# Patient Record
Sex: Male | Born: 1975 | Race: White | Hispanic: No | Marital: Single | Smoking: Current every day smoker
Health system: Southern US, Community
[De-identification: ages and names within clinical notes are randomized; demographics above are authoritative.]

## PROBLEM LIST (undated history)

## (undated) DIAGNOSIS — C801 Malignant (primary) neoplasm, unspecified: Secondary | ICD-10-CM

---

## 2013-12-13 ENCOUNTER — Emergency Department (HOSPITAL_COMMUNITY)
Admission: EM | Admit: 2013-12-13 | Discharge: 2013-12-13 | Disposition: A | Discharge status: Home / Self Care | Payer: BC Managed Care – PPO | Attending: Emergency Medicine | Admitting: Emergency Medicine

## 2013-12-13 ENCOUNTER — Emergency Department (HOSPITAL_COMMUNITY): Payer: BC Managed Care – PPO

## 2013-12-13 ENCOUNTER — Encounter (HOSPITAL_COMMUNITY): Payer: Self-pay | Admitting: Emergency Medicine

## 2013-12-13 DIAGNOSIS — S0083XA Contusion of other part of head, initial encounter: Principal | ICD-10-CM | POA: Insufficient documentation

## 2013-12-13 DIAGNOSIS — S0003XA Contusion of scalp, initial encounter: Secondary | ICD-10-CM | POA: Insufficient documentation

## 2013-12-13 DIAGNOSIS — S0180XA Unspecified open wound of other part of head, initial encounter: Secondary | ICD-10-CM | POA: Insufficient documentation

## 2013-12-13 DIAGNOSIS — R202 Paresthesia of skin: Secondary | ICD-10-CM

## 2013-12-13 DIAGNOSIS — S8990XA Unspecified injury of unspecified lower leg, initial encounter: Secondary | ICD-10-CM | POA: Diagnosis not present

## 2013-12-13 DIAGNOSIS — F17200 Nicotine dependence, unspecified, uncomplicated: Secondary | ICD-10-CM | POA: Insufficient documentation

## 2013-12-13 DIAGNOSIS — S1093XA Contusion of unspecified part of neck, initial encounter: Principal | ICD-10-CM

## 2013-12-13 DIAGNOSIS — Z23 Encounter for immunization: Secondary | ICD-10-CM | POA: Insufficient documentation

## 2013-12-13 DIAGNOSIS — Y92410 Unspecified street and highway as the place of occurrence of the external cause: Secondary | ICD-10-CM | POA: Diagnosis not present

## 2013-12-13 DIAGNOSIS — Y9389 Activity, other specified: Secondary | ICD-10-CM | POA: Diagnosis not present

## 2013-12-13 DIAGNOSIS — S335XXA Sprain of ligaments of lumbar spine, initial encounter: Secondary | ICD-10-CM | POA: Diagnosis not present

## 2013-12-13 DIAGNOSIS — S0181XA Laceration without foreign body of other part of head, initial encounter: Secondary | ICD-10-CM

## 2013-12-13 DIAGNOSIS — S0990XA Unspecified injury of head, initial encounter: Secondary | ICD-10-CM | POA: Diagnosis present

## 2013-12-13 DIAGNOSIS — S3981XA Other specified injuries of abdomen, initial encounter: Secondary | ICD-10-CM | POA: Insufficient documentation

## 2013-12-13 DIAGNOSIS — S0093XA Contusion of unspecified part of head, initial encounter: Secondary | ICD-10-CM

## 2013-12-13 DIAGNOSIS — S99929A Unspecified injury of unspecified foot, initial encounter: Secondary | ICD-10-CM

## 2013-12-13 DIAGNOSIS — S39012A Strain of muscle, fascia and tendon of lower back, initial encounter: Secondary | ICD-10-CM

## 2013-12-13 DIAGNOSIS — S99919A Unspecified injury of unspecified ankle, initial encounter: Secondary | ICD-10-CM | POA: Diagnosis not present

## 2013-12-13 LAB — CBC
HEMATOCRIT: 44.9 % (ref 39.0–52.0)
Hemoglobin: 15.6 g/dL (ref 13.0–17.0)
MCH: 30.8 pg (ref 26.0–34.0)
MCHC: 34.7 g/dL (ref 30.0–36.0)
MCV: 88.7 fL (ref 78.0–100.0)
PLATELETS: 281 10*3/uL (ref 150–400)
RBC: 5.06 MIL/uL (ref 4.22–5.81)
RDW: 13.1 % (ref 11.5–15.5)
WBC: 9.4 10*3/uL (ref 4.0–10.5)

## 2013-12-13 LAB — URINALYSIS, ROUTINE W REFLEX MICROSCOPIC
Bilirubin Urine: NEGATIVE
GLUCOSE, UA: NEGATIVE mg/dL
Hgb urine dipstick: NEGATIVE
Ketones, ur: NEGATIVE mg/dL
LEUKOCYTES UA: NEGATIVE
Nitrite: NEGATIVE
PH: 7 (ref 5.0–8.0)
Protein, ur: NEGATIVE mg/dL
SPECIFIC GRAVITY, URINE: 1.009 (ref 1.005–1.030)
Urobilinogen, UA: 0.2 mg/dL (ref 0.0–1.0)

## 2013-12-13 LAB — PROTIME-INR
INR: 1.04 (ref 0.00–1.49)
Prothrombin Time: 13.4 seconds (ref 11.6–15.2)

## 2013-12-13 LAB — SAMPLE TO BLOOD BANK

## 2013-12-13 LAB — COMPREHENSIVE METABOLIC PANEL
ALBUMIN: 4.3 g/dL (ref 3.5–5.2)
ALK PHOS: 69 U/L (ref 39–117)
ALT: 29 U/L (ref 0–53)
AST: 24 U/L (ref 0–37)
BILIRUBIN TOTAL: 0.3 mg/dL (ref 0.3–1.2)
BUN: 13 mg/dL (ref 6–23)
CHLORIDE: 104 meq/L (ref 96–112)
CO2: 24 mEq/L (ref 19–32)
Calcium: 9.5 mg/dL (ref 8.4–10.5)
Creatinine, Ser: 1.09 mg/dL (ref 0.50–1.35)
GFR calc Af Amer: >90 mL/min (ref >90)
GFR calc non Af Amer: 85 mL/min — ABNORMAL LOW (ref >90)
Glucose, Bld: 114 mg/dL — ABNORMAL HIGH (ref 70–99)
Potassium: 3.8 mEq/L (ref 3.7–5.3)
SODIUM: 144 meq/L (ref 137–147)
TOTAL PROTEIN: 7.5 g/dL (ref 6.0–8.3)

## 2013-12-13 LAB — ETHANOL: Alcohol, Ethyl (B): 201 mg/dL — ABNORMAL HIGH (ref 0–11)

## 2013-12-13 LAB — I-STAT CG4 LACTIC ACID, ED: LACTIC ACID, VENOUS: 2.34 mmol/L — AB (ref 0.5–2.2)

## 2013-12-13 LAB — I-STAT CHEM 8, ED
BUN: 12 mg/dL (ref 6–23)
CALCIUM ION: 1.1 mmol/L — AB (ref 1.12–1.23)
CHLORIDE: 106 meq/L (ref 96–112)
Creatinine, Ser: 1.4 mg/dL — ABNORMAL HIGH (ref 0.50–1.35)
Glucose, Bld: 111 mg/dL — ABNORMAL HIGH (ref 70–99)
HCT: 47 % (ref 39.0–52.0)
Hemoglobin: 16 g/dL (ref 13.0–17.0)
POTASSIUM: 3.5 meq/L — AB (ref 3.7–5.3)
Sodium: 145 mEq/L (ref 137–147)
TCO2: 25 mmol/L (ref 0–100)

## 2013-12-13 MED ORDER — IOHEXOL 300 MG/ML  SOLN: 100.0000 mL | Freq: Once | INTRAMUSCULAR | Status: DC | PRN

## 2013-12-13 MED ORDER — TETANUS-DIPHTHERIA TOXOIDS TD 5-2 LFU IM INJ
0.5000 mL | INJECTION | Freq: Once | INTRAMUSCULAR | Status: DC
  Filled 2013-12-13: qty 0.5

## 2013-12-13 MED ORDER — MORPHINE SULFATE 4 MG/ML IJ SOLN
4.0000 mg | Freq: Once | INTRAMUSCULAR | Status: AC
  Administered 2013-12-13: 4 mg via INTRAVENOUS
  Filled 2013-12-13: qty 1

## 2013-12-13 MED ORDER — OXYCODONE-ACETAMINOPHEN 5-325 MG PO TABS: 2.0000 | ORAL_TABLET | ORAL | Status: DC | PRN

## 2013-12-13 MED ORDER — NAPROXEN 500 MG PO TABS: 500.0000 mg | ORAL_TABLET | Freq: Two times a day (BID) | ORAL | Status: DC

## 2013-12-13 MED ORDER — SODIUM CHLORIDE 0.9 % IV SOLN
INTRAVENOUS | Status: AC | PRN
  Administered 2013-12-13: 10 mL/h via INTRAVENOUS

## 2013-12-13 MED ORDER — METHOCARBAMOL 750 MG PO TABS: 750.0000 mg | ORAL_TABLET | Freq: Four times a day (QID) | ORAL | Status: DC

## 2013-12-13 MED ORDER — METHOCARBAMOL 500 MG PO TABS
1000.0000 mg | ORAL_TABLET | Freq: Once | ORAL | Status: AC
  Administered 2013-12-13: 1000 mg via ORAL
  Filled 2013-12-13: qty 2

## 2013-12-13 MED ORDER — TETANUS-DIPHTH-ACELL PERTUSSIS 5-2.5-18.5 LF-MCG/0.5 IM SUSP
0.5000 mL | Freq: Once | INTRAMUSCULAR | Status: AC
  Administered 2013-12-13: 0.5 mL via INTRAMUSCULAR

## 2013-12-13 NOTE — ED Notes (Signed)
Dr at bedside for suturing.

## 2013-12-13 NOTE — ED Notes (Signed)
Patient transported to CT 

## 2013-12-13 NOTE — ED Notes (Signed)
Pt able to ambulate down hall without assistance
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
## 2013-12-13 NOTE — ED Notes (Signed)
Returned from ct 

## 2013-12-13 NOTE — ED Notes (Signed)
See trauma narrator 
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
## 2014-07-20 ENCOUNTER — Emergency Department (HOSPITAL_COMMUNITY)
Admission: EM | Admit: 2014-07-20 | Discharge: 2014-07-20 | Disposition: A | Discharge status: Home / Self Care | Payer: Self-pay | Attending: Emergency Medicine | Admitting: Emergency Medicine

## 2014-07-20 ENCOUNTER — Emergency Department (HOSPITAL_COMMUNITY)
Admission: EM | Admit: 2014-07-20 | Discharge: 2014-07-20 | Disposition: A | Discharge status: Home / Self Care | Payer: BC Managed Care – PPO | Attending: Emergency Medicine | Admitting: Emergency Medicine

## 2014-07-20 ENCOUNTER — Encounter (HOSPITAL_COMMUNITY): Payer: Self-pay | Admitting: *Deleted

## 2014-07-20 DIAGNOSIS — Z79899 Other long term (current) drug therapy: Secondary | ICD-10-CM | POA: Insufficient documentation

## 2014-07-20 DIAGNOSIS — Z791 Long term (current) use of non-steroidal anti-inflammatories (NSAID): Secondary | ICD-10-CM | POA: Insufficient documentation

## 2014-07-20 DIAGNOSIS — T542X1A Toxic effect of corrosive acids and acid-like substances, accidental (unintentional), initial encounter: Secondary | ICD-10-CM | POA: Diagnosis not present

## 2014-07-20 DIAGNOSIS — H578 Other specified disorders of eye and adnexa: Secondary | ICD-10-CM | POA: Insufficient documentation

## 2014-07-20 DIAGNOSIS — Y9389 Activity, other specified: Secondary | ICD-10-CM | POA: Insufficient documentation

## 2014-07-20 DIAGNOSIS — T2662XA Corrosion of cornea and conjunctival sac, left eye, initial encounter: Secondary | ICD-10-CM | POA: Diagnosis not present

## 2014-07-20 DIAGNOSIS — Y9289 Other specified places as the place of occurrence of the external cause: Secondary | ICD-10-CM | POA: Insufficient documentation

## 2014-07-20 DIAGNOSIS — Z720 Tobacco use: Secondary | ICD-10-CM | POA: Diagnosis not present

## 2014-07-20 DIAGNOSIS — X58XXXA Exposure to other specified factors, initial encounter: Secondary | ICD-10-CM | POA: Diagnosis not present

## 2014-07-20 DIAGNOSIS — T2661XA Corrosion of cornea and conjunctival sac, right eye, initial encounter: Secondary | ICD-10-CM | POA: Diagnosis not present

## 2014-07-20 DIAGNOSIS — Y998 Other external cause status: Secondary | ICD-10-CM | POA: Diagnosis not present

## 2014-07-20 MED ORDER — ERYTHROMYCIN 5 MG/GM OP OINT: TOPICAL_OINTMENT | Freq: Once | OPHTHALMIC | Status: DC

## 2014-07-20 MED ORDER — TETRACAINE HCL 0.5 % OP SOLN
2.0000 [drp] | Freq: Once | OPHTHALMIC | Status: AC
  Administered 2014-07-20: 2 [drp] via OPHTHALMIC
  Filled 2014-07-20: qty 2

## 2014-07-20 MED ORDER — ARTIFICIAL TEARS OP OINT
TOPICAL_OINTMENT | Freq: Once | OPHTHALMIC | Status: AC
  Administered 2014-07-20: 07:00:00 via OPHTHALMIC
  Filled 2014-07-20: qty 3.5

## 2014-07-20 NOTE — ED Notes (Signed)
Bed: BW38 Expected date:  Expected time:  Means of arrival:  Comments: EMS--Red eyes

## 2014-07-20 NOTE — Discharge Instructions (Signed)
Please use the antibiotic ointment and the artificial tears. See the Eye doctor in 2-3 days.  Return to the ER if there is any visual complains, severe pain, or pus drainage.   Chemical Conjunctivitis Chemical conjunctivitis is an irritation of the underside of the eyelid and the white part of the eye. Conjunctivitis can be caused by infection, allergy or chemical irritation. In your case it has been caused by a chemical irritation of the eye. Symptoms almost always include: tearing, light sensitivity, gritty feeling (sensation) in the eyes, swelling of your eyelids, and often severe pain. In spite of the severe pain, this irritation will run its course and will improve within 24 hours.  HOME CARE INSTRUCTIONS   To ease discomfort apply a cool, clean wash cloth to your eye for 10 to 20 minutes, 3 to 4 times per day.  Do not rub your eyes.  Gently wipe away any discharge from the eyes with moistened tissues.  Wash your hands often with soap and use paper towels to dry.  Sunglasses may be helpful if light bothers your eyes.  Do not use eye make-up.  Do not use contact lenses until the irritation is gone.  Do not operate machinery or drive if your vision is blurred.  Take medications as directed by your caregiver. Artificial tears may ease discomfort.  Avoid the chemical or surroundings which caused the problem. Always use eye protection as necessary. SEEK MEDICAL CARE IF:   The eye is still pink (inflamed) 3 days after beginning treatment.  Pain in the eye increases.  You have discharge coming from either eye.  Your eyelids are stuck together in the morning.  You have an increased sensitivity to light.  An oral temperature above 102 F (38.9 C) develops.  You develop facial pain.  You have any problems that may be related to the medicine you are taking. SEEK IMMEDIATE MEDICAL CARE IF:   Your vision is getting worse.  You develop severe eye pain. MAKE SURE YOU:    Understand these instructions.  Will watch your condition.  Will get help right away if you are not doing well or get worse. Document Released: 04/27/2005 Document Revised: 10/10/2011 Document Reviewed: 03/05/2008 [facility] Patient Information 2015 [place], [place]. This information is not intended to replace advice given to you by your health care provider. Make sure you discuss any questions you have with your health care provider.
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
## 2014-07-20 NOTE — ED Notes (Signed)
MD at bedside. 

## 2014-07-20 NOTE — ED Notes (Signed)
Bilateral Morgen's lens placed by EDP  Irrigation started
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
## 2014-07-20 NOTE — ED Notes (Signed)
Patient used wrong contact lens solution--used cleaning solution that has hydrogen peroxide, instead of saline rinsing solution Patient washed eyes out with copious amounts of water before calling EMS and when they arrived on scene Patient wanted to come to ED because his eye are still red

## 2015-06-25 IMAGING — CT CT HEAD W/O CM
3 of 6 series · 12 of 47 positions shown, 14 images · non-contrast
Comparison: None.

CLINICAL DATA: Trauma.

EXAM:
CT HEAD WITHOUT CONTRAST
CT CERVICAL SPINE WITHOUT CONTRAST
TECHNIQUE: Multidetector CT imaging of the head and cervical spine was
performed following the standard protocol without intravenous
contrast. Multiplanar CT image reconstructions of the cervical spine
were also generated.

[Series 3: head 2.0 h70h · axial · 0.49mm/px · 1 of 90 slices shown]
[im 13/90  brain]
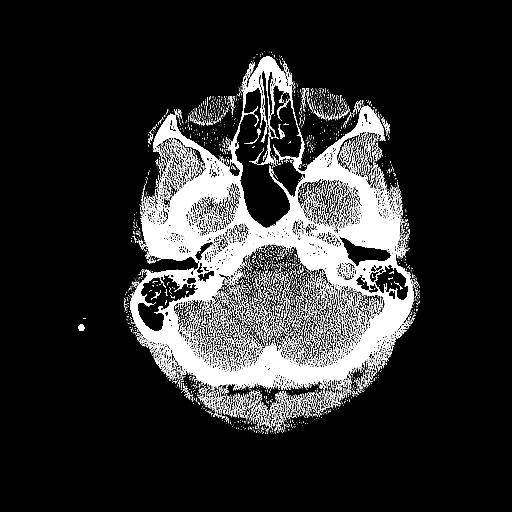

[Series 7: coronals · coronal · 0.26mm/px · 3 of 57 slices shown]
[im 19/57  brain]
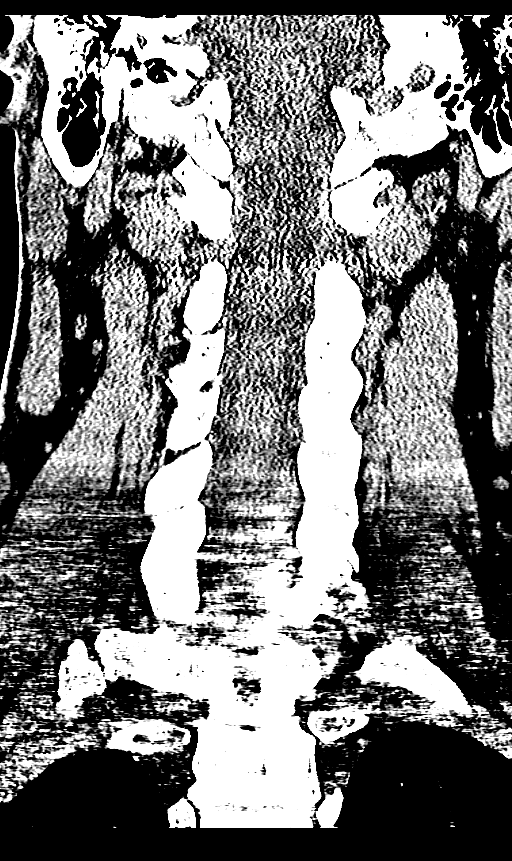
[im 25/57  brain]
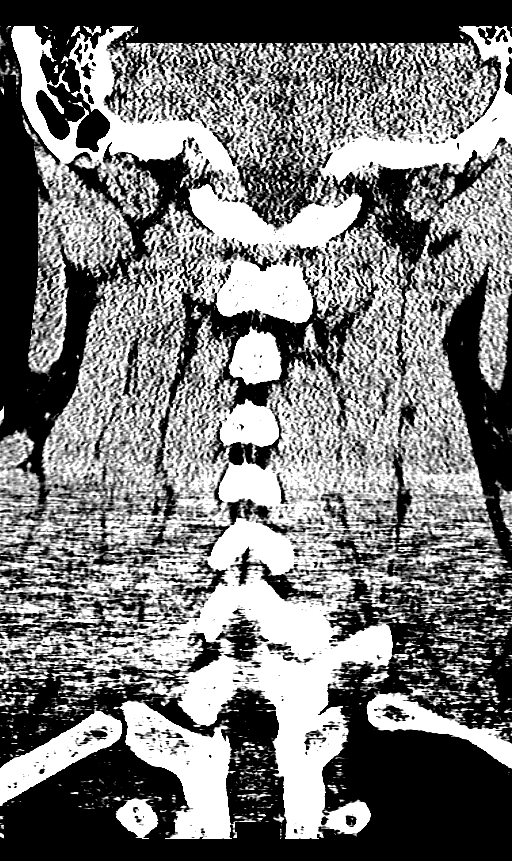
[im 32/57  brain]
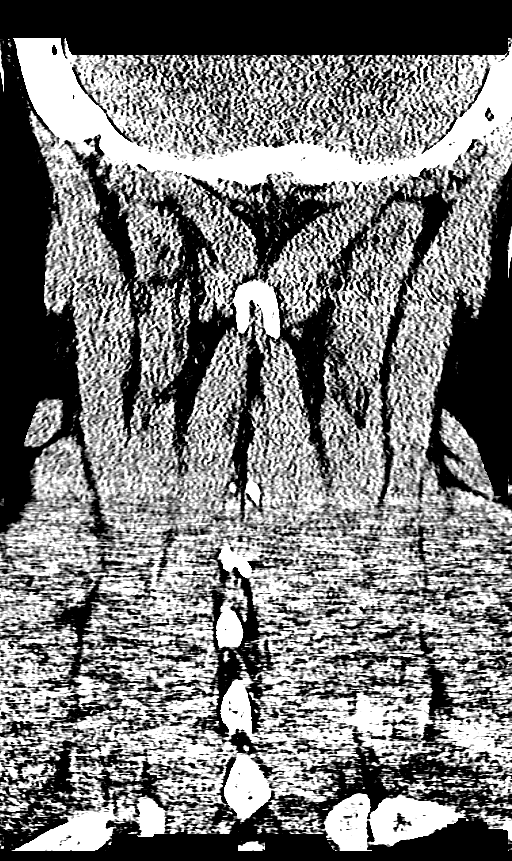

[Series 9: orthogonals · axial · 0.25mm/px · z∈[+1508,+1670]mm · 8 of 111 slices shown, 10 images]
[im 13/111  brain]
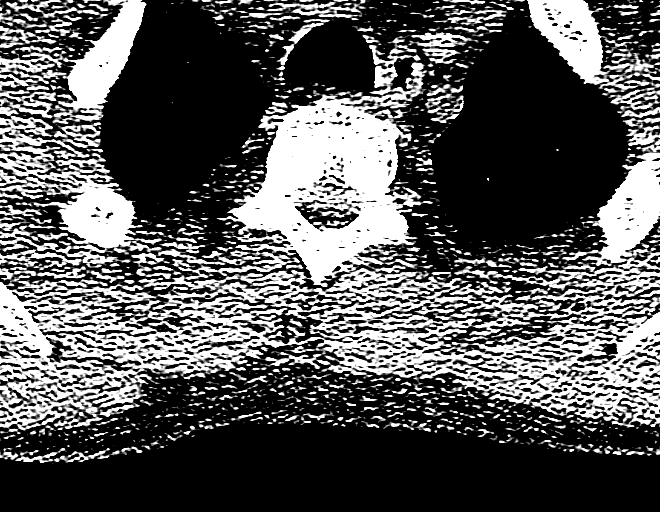
[im 13/111  bone]
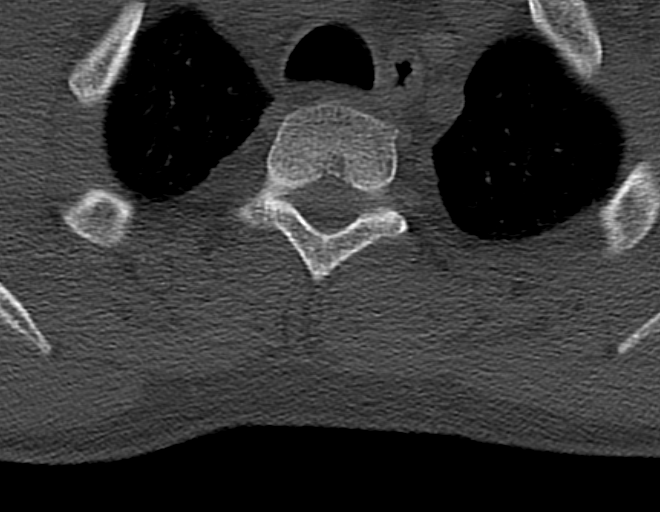
[im 25/111  brain]
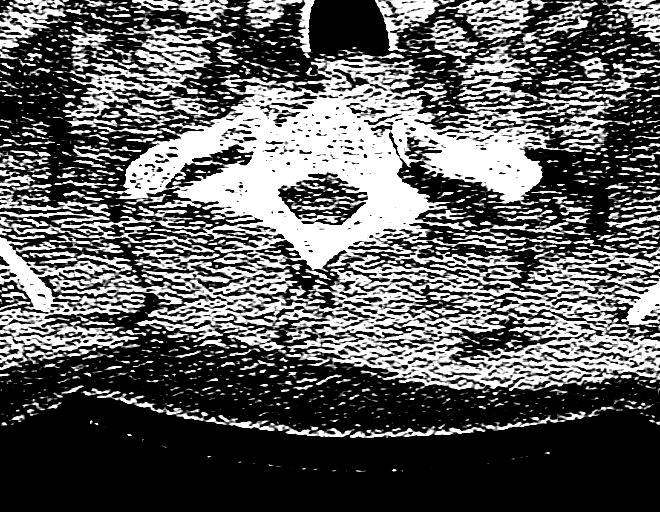
[im 37/111  brain]
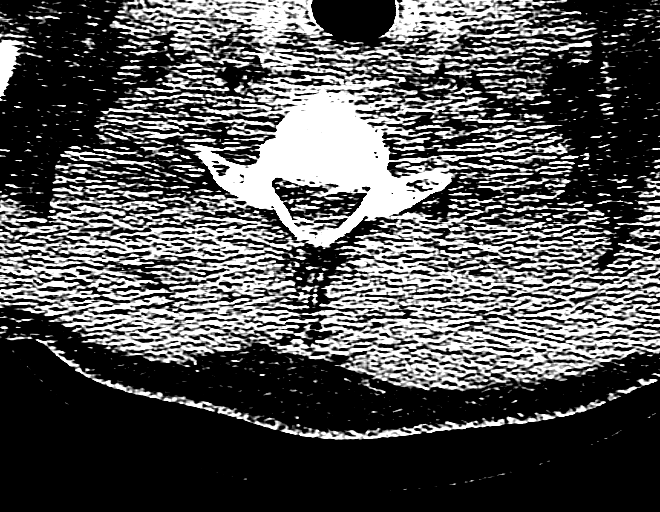
[im 49/111  brain]
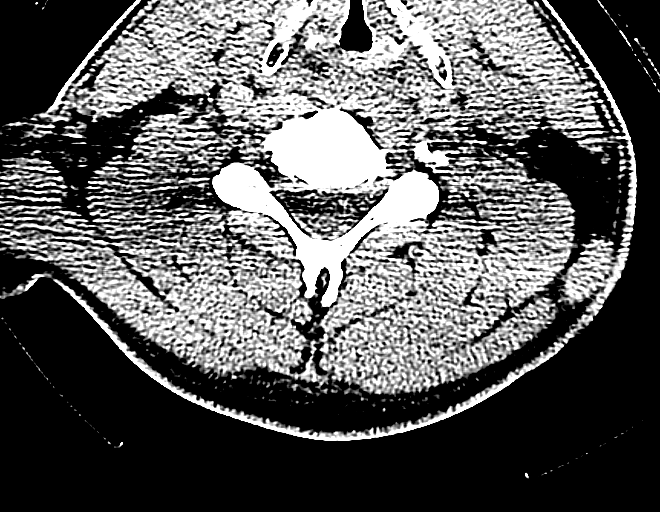
[im 62/111  brain]
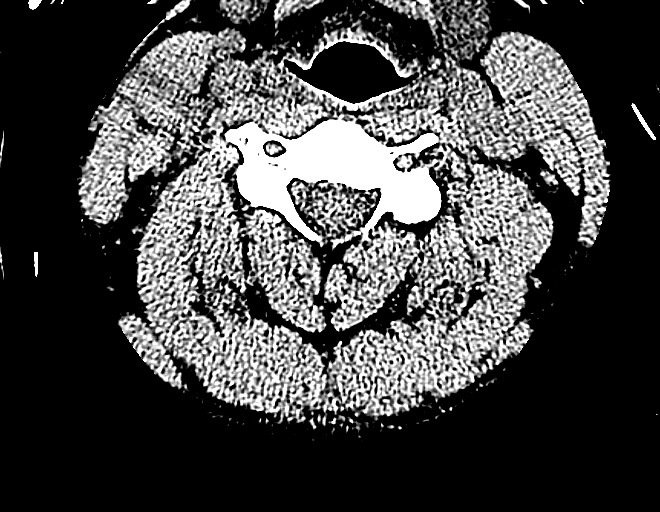
[im 62/111  bone]
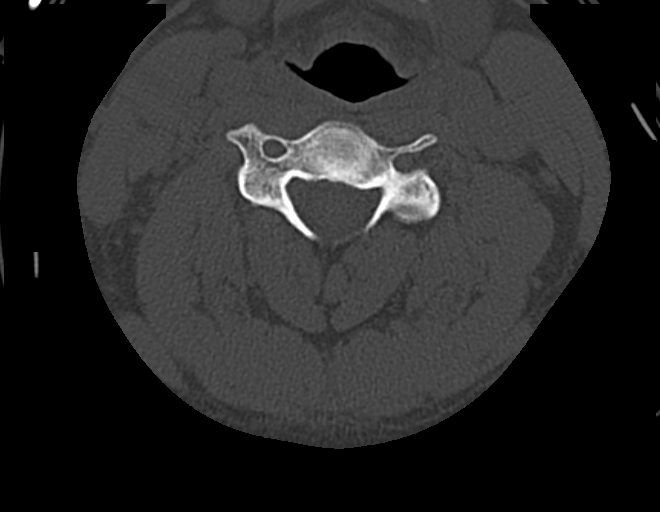
[im 74/111  brain]
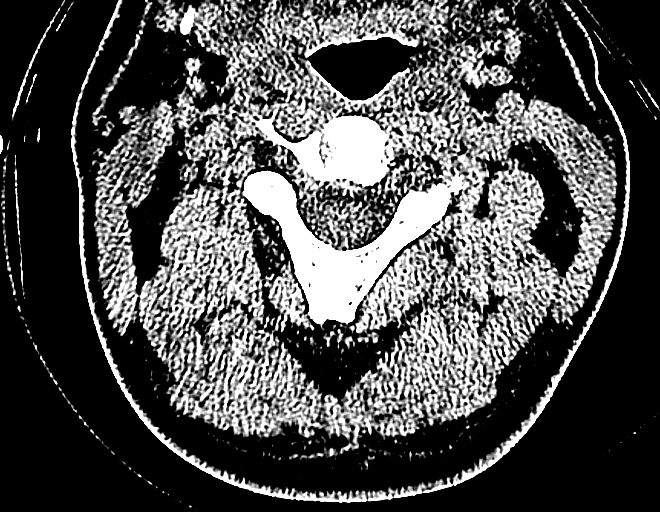
[im 86/111  brain]
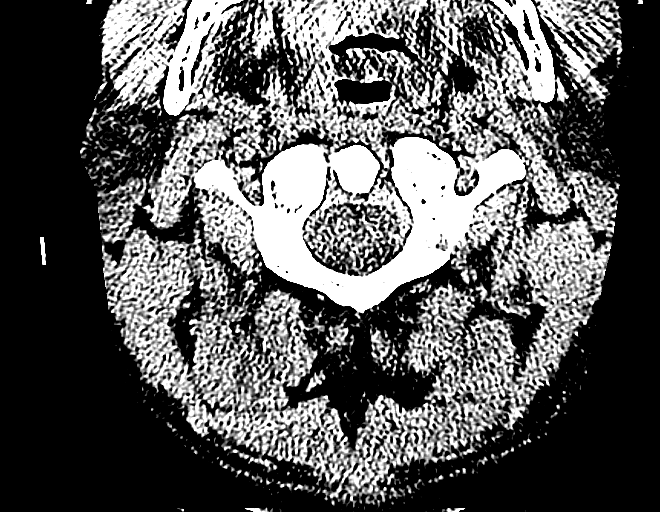
[im 98/111  brain]
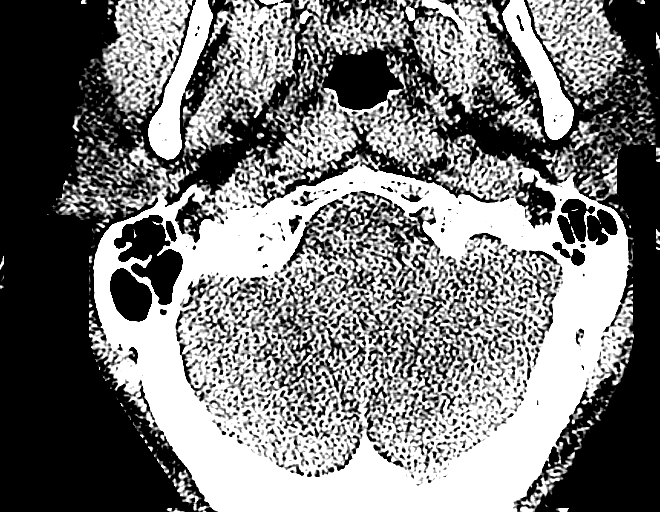

[12 of 47 positions shown; findings below may reference images not displayed]

FINDINGS: CT HEAD FINDINGS

Ventricles and sulci appear symmetrical. Small subcutaneous scalp
hematoma and laceration over the right supraorbital region. No mass
effect or midline shift. No abnormal extra-axial fluid collections.
Gray-white matter junctions are distinct. Basal cisterns are not
effaced. No evidence of acute intracranial hemorrhage. No depressed
skull fractures. Minimal mucosal thickening in the maxillary antra
and ethmoid air cells.

CT CERVICAL SPINE FINDINGS

Straightening of the usual cervical lordosis which may be due to
patient positioning but ligamentous injury or muscle spasm could
also have this appearance and are not excluded. Degenerative changes
with disc space narrowing and endplate hypertrophic changes at C5-6
and C6-7 levels. Disc space heights are otherwise preserved. No
vertebral compression deformities. Normal alignment of the facet
joints. C1-2 articulation appears intact. No prevertebral soft
tissue swelling. No focal bone lesion or bone destruction. Bone
cortex and trabecular architecture appear intact.
IMPRESSION: No acute intracranial abnormalities.

Nonspecific straightening of the usual cervical lordosis. No
displaced fractures are appreciated.
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
## 2016-01-13 ENCOUNTER — Ambulatory Visit (INDEPENDENT_AMBULATORY_CARE_PROVIDER_SITE_OTHER): Payer: BLUE CROSS/BLUE SHIELD | Admitting: Family Medicine

## 2016-01-13 VITALS — BP 124/87 | HR 93 | Temp 98.0°F | Resp 16 | Ht 71.0 in | Wt 198.0 lb

## 2016-01-13 DIAGNOSIS — L989 Disorder of the skin and subcutaneous tissue, unspecified: Secondary | ICD-10-CM

## 2016-01-13 DIAGNOSIS — M722 Plantar fascial fibromatosis: Secondary | ICD-10-CM

## 2016-01-13 DIAGNOSIS — D2339 Other benign neoplasm of skin of other parts of face: Secondary | ICD-10-CM
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
## 2016-01-13 NOTE — Patient Instructions (Addendum)
WOUND CARE Please return in 7 days to have your stitches/staples removed or sooner if you have concerns. Marland Kitchen Keep area clean and dry for 24 hours. Do not remove bandage, if applied. . After 24 hours, remove bandage and wash wound gently with mild soap and warm water. Reapply a new bandage after cleaning wound, if directed. . Continue daily cleansing with soap and water until stitches/staples are removed. . Do not apply any ointments or creams to the wound while stitches/staples are in place, as this may cause delayed healing. . Notify the office if you experience any of the following signs of infection: Swelling, redness, pus drainage, streaking, fever >101.0 F . Notify the office if you experience excessive bleeding that does not stop after 15-20 minutes of constant, firm pressure.      IF you received an x-ray today, you will receive an invoice from [facility]. Please contact [facility] at [phone] with questions or concerns regarding your invoice.   IF you received labwork today, you will receive an invoice from [employer]. Please contact [facility] at [phone] with questions or concerns regarding your invoice.   Our billing staff will not be able to assist you with questions regarding bills from these companies.  You will be contacted with the lab results as soon as they are available. The fastest way to get your results is to activate your My Chart account. Instructions are located on the last page of this paperwork. If you have not heard from [place] regarding the results in 2 weeks, please contact this office.

## 2016-01-13 NOTE — Progress Notes (Signed)
Patient ID: Bruce Rhodes, male   DOB: 1976/02/02, 40 y.o.   MRN: [id number]  Procedure:  Anesthetic allergies reviewed.  Patient anesthetized with 2% Lidocaine with epi x 3.  The first lesion was excised via 3 mm punch and repaired with one HM matress suture with 6-0 vicryl.  The second lesion was cut away in an elliptical fashion and repaired with one HM suture and one simple.  The lesion of his back was cut away in an elliptical fashion and repaired with 6-0 simple running suture.  The patient tolerated the procedure without complaint. Philis Fendt, MS, PA-C 2:23 PM, 01/13/2016

## 2020-11-14 ENCOUNTER — Other Ambulatory Visit: Payer: Self-pay

## 2020-11-14 ENCOUNTER — Emergency Department (HOSPITAL_COMMUNITY)
Admission: EM | Admit: 2020-11-14 | Discharge: 2020-11-14 | Disposition: A | Discharge status: Home / Self Care | Payer: 59 | Attending: Emergency Medicine | Admitting: Emergency Medicine

## 2020-11-14 ENCOUNTER — Encounter (HOSPITAL_COMMUNITY): Payer: Self-pay

## 2020-11-14 DIAGNOSIS — R630 Anorexia: Secondary | ICD-10-CM | POA: Insufficient documentation

## 2020-11-14 DIAGNOSIS — Z8547 Personal history of malignant neoplasm of testis: Secondary | ICD-10-CM | POA: Insufficient documentation

## 2020-11-14 DIAGNOSIS — R5381 Other malaise: Secondary | ICD-10-CM | POA: Diagnosis not present

## 2020-11-14 DIAGNOSIS — R5383 Other fatigue: Secondary | ICD-10-CM | POA: Diagnosis not present

## 2020-11-14 DIAGNOSIS — F17200 Nicotine dependence, unspecified, uncomplicated: Secondary | ICD-10-CM | POA: Insufficient documentation

## 2020-11-14 DIAGNOSIS — R55 Syncope and collapse: Secondary | ICD-10-CM | POA: Insufficient documentation

## 2020-11-14 DIAGNOSIS — R110 Nausea: Secondary | ICD-10-CM | POA: Diagnosis not present

## 2020-11-14 DIAGNOSIS — R509 Fever, unspecified: Secondary | ICD-10-CM | POA: Insufficient documentation

## 2020-11-14 HISTORY — DX: Malignant (primary) neoplasm, unspecified: C80.1

## 2020-11-14 LAB — COMPREHENSIVE METABOLIC PANEL
ALT: 24 U/L (ref 0–44)
AST: 17 U/L (ref 15–41)
Albumin: 3.6 g/dL (ref 3.5–5.0)
Alkaline Phosphatase: 59 U/L (ref 38–126)
Anion gap: 9 (ref 5–15)
BUN: 27 mg/dL — ABNORMAL HIGH (ref 6–20)
CO2: 26 mmol/L (ref 22–32)
Calcium: 8.6 mg/dL — ABNORMAL LOW (ref 8.9–10.3)
Chloride: 99 mmol/L (ref 98–111)
Creatinine, Ser: 1.15 mg/dL (ref 0.61–1.24)
GFR, Estimated: >60 mL/min (ref >60)
Glucose, Bld: 102 mg/dL — ABNORMAL HIGH (ref 70–99)
Potassium: 3.5 mmol/L (ref 3.5–5.1)
Sodium: 134 mmol/L — ABNORMAL LOW (ref 135–145)
Total Bilirubin: 1 mg/dL (ref 0.3–1.2)
Total Protein: 6.7 g/dL (ref 6.5–8.1)

## 2020-11-14 LAB — CBC WITH DIFFERENTIAL/PLATELET
Abs Immature Granulocytes: 0.1 10*3/uL — ABNORMAL HIGH (ref 0.00–0.07)
Basophils Absolute: 0 10*3/uL (ref 0.0–0.1)
Basophils Relative: 0 %
Eosinophils Absolute: 0 10*3/uL (ref 0.0–0.5)
Eosinophils Relative: 0 %
HCT: 38.4 % — ABNORMAL LOW (ref 39.0–52.0)
Hemoglobin: 13.6 g/dL (ref 13.0–17.0)
Immature Granulocytes: 1 %
Lymphocytes Relative: 9 %
Lymphs Abs: 1.3 10*3/uL (ref 0.7–4.0)
MCH: 30 pg (ref 26.0–34.0)
MCHC: 35.4 g/dL (ref 30.0–36.0)
MCV: 84.6 fL (ref 80.0–100.0)
Monocytes Absolute: 0.3 10*3/uL (ref 0.1–1.0)
Monocytes Relative: 2 %
Neutro Abs: 12.4 10*3/uL — ABNORMAL HIGH (ref 1.7–7.7)
Neutrophils Relative %: 88 %
Platelets: 288 10*3/uL (ref 150–400)
RBC: 4.54 MIL/uL (ref 4.22–5.81)
RDW: 13.1 % (ref 11.5–15.5)
WBC: 14 10*3/uL — ABNORMAL HIGH (ref 4.0–10.5)
nRBC: 0 % (ref 0.0–0.2)

## 2020-11-14 MED ORDER — SODIUM CHLORIDE 0.9 % IV BOLUS
1000.0000 mL | Freq: Once | INTRAVENOUS | Status: AC
Start: 2020-11-14 — End: 2020-11-14
  Administered 2020-11-14: 1000 mL via INTRAVENOUS

## 2020-11-14 MED ORDER — SODIUM CHLORIDE 0.9 % IV BOLUS
1000.0000 mL | Freq: Once | INTRAVENOUS | Status: AC
  Administered 2020-11-14: 1000 mL via INTRAVENOUS

## 2020-11-14 NOTE — ED Triage Notes (Signed)
Patient coming from [facility] with c/o syncope episode. Loss of LOC. Patient is currently being treated for cancer. Last chemo yesterday. Given NS 900 cc per ems. BP was 70 systolic at seen.

## 2020-11-14 NOTE — ED Provider Notes (Signed)
[facility] DEPT Provider Note   CSN: [id number] Arrival date & time: 11/14/20  1043     History No chief complaint on file.   Bruce Rhodes is a 45 y.o. male.  He was just admitted to an outside hospital for neutropenia and fever.  Cultures were within normal limits.  He has been struggling with some mouth ulcers, and he has not had much of an appetite.  When he was at [facility], he started to feel very dizzy, sat down, and had a presyncopal episode.  About 5 minutes later, he had another very brief episode.  He was attended by family and bystanders immediately.  EMS gave him IV fluids for a systolic blood pressure of 70.  The history is provided by the patient.  Loss of Consciousness Episode history:  Multiple Most recent episode:  Today Timing:  Constant Progression:  Resolved Chronicity:  New Context comment:  On chemotherapy for left testicular cancer and has not been eating and drinking well secondary to medication side effects. Witnessed: yes   Relieved by:  Lying down Worsened by:  Nothing Ineffective treatments:  None tried Associated symptoms: malaise/fatigue   Associated symptoms: no chest pain, no confusion, no diaphoresis, no difficulty breathing, no fever, no focal weakness, no palpitations, no recent fall, no recent injury, no seizures, no shortness of breath, no visual change and no vomiting        Past Medical History:  Diagnosis Date  . Cancer [facility])    testicular CA    There are no problems to display for this patient.   No past surgical history on file.     No family history on file.  Social History   Tobacco Use  . Smoking status: Current Every Day Smoker  . Smokeless tobacco: Never Used  Substance Use Topics  . Alcohol use: Not Currently  . Drug use: No    Home Medications Prior to Admission medications   Medication Sig Start Date End Date Taking? Authorizing Provider  hydroxypropyl methylcellulose /  hypromellose (ISOPTO TEARS / GONIOVISC) 2.5 % ophthalmic solution Place 1 drop into both eyes 3 (three) times daily as needed for dry eyes. Reported on 01/13/2016    [provider]  hydroxypropyl methylcellulose / hypromellose (ISOPTO TEARS / GONIOVISC) 2.5 % ophthalmic solution Place 1 drop into both eyes 3 (three) times daily as needed for dry eyes. Reported on 01/13/2016    [provider]  ibuprofen (ADVIL,MOTRIN) 200 MG tablet Take 400 mg by mouth every 6 (six) hours as needed for moderate pain.    [provider]  methocarbamol (ROBAXIN-750) 750 MG tablet Take 1 tablet (750 mg total) by mouth 4 (four) times daily. Patient not taking: Reported on 07/20/2014 12/13/13   Linton Flemings, MD  naproxen (NAPROSYN) 500 MG tablet Take 1 tablet (500 mg total) by mouth 2 (two) times daily. Patient not taking: Reported on 07/20/2014 12/13/13   Linton Flemings, MD  oxyCODONE-acetaminophen (PERCOCET/ROXICET) 5-325 MG per tablet Take 2 tablets by mouth every 4 (four) hours as needed for severe pain. Patient not taking: Reported on 07/20/2014 12/13/13   Linton Flemings, MD  Polyethyl Glycol-Propyl Glycol (SYSTANE ULTRA) 0.4-0.3 % SOLN Apply 1 drop to eye 2 (two) times daily as needed (dry eyes). Reported on 01/13/2016    [provider]  Polyethyl Glycol-Propyl Glycol (SYSTANE ULTRA) 0.4-0.3 % SOLN Apply 1 drop to eye 2 (two) times daily as needed (dry eyes). Reported on 01/13/2016    [provider]  tadalafil (CIALIS) 5 MG tablet Take 5-15 mg by mouth daily as needed for erectile dysfunction.    [provider]    Allergies    Patient has no known allergies.  Review of Systems   Review of Systems  Constitutional: Positive for malaise/fatigue. Negative for chills, diaphoresis and fever.  HENT: Negative for ear pain and sore throat.   Eyes: Negative for pain and visual disturbance.  Respiratory: Negative for cough and shortness of breath.   Cardiovascular: Positive  for syncope. Negative for chest pain and palpitations.  Gastrointestinal: Negative for abdominal pain and vomiting.  Genitourinary: Negative for dysuria and hematuria.  Musculoskeletal: Negative for arthralgias and back pain.  Skin: Negative for color change and rash.  Neurological: Negative for focal weakness, seizures and syncope.  Psychiatric/Behavioral: Negative for confusion.  All other systems reviewed and are negative.   Physical Exam Updated Vital Signs BP 112/72 (BP Location: Right Arm)   Pulse 72   Temp 97.6 F (36.4 C) (Oral)   Resp 18   Ht 6' (1.829 m)   Wt 92.1 kg   SpO2 97% Comment: Simultaneous filing. User may not have seen previous data.  BMI 27.53 kg/m   Physical Exam Vitals and nursing note reviewed.  Constitutional:      Appearance: He is well-developed.  HENT:     Head: Normocephalic and atraumatic.     Mouth/Throat:     Mouth: Mucous membranes are dry.     Comments: Somewhat swollen mucosa with few to no actual ulcerations Eyes:     Conjunctiva/sclera: Conjunctivae normal.  Cardiovascular:     Rate and Rhythm: Normal rate and regular rhythm.     Heart sounds: No murmur heard.   Pulmonary:     Effort: Pulmonary effort is normal. No respiratory distress.     Breath sounds: Normal breath sounds.  Abdominal:     Palpations: Abdomen is soft.     Tenderness: There is no abdominal tenderness.  Musculoskeletal:     Cervical back: Neck supple.  Skin:    General: Skin is warm and dry.  Neurological:     General: No focal deficit present.     Mental Status: He is alert. Mental status is at baseline.  Psychiatric:        Mood and Affect: Mood normal.     ED Results / Procedures / Treatments   Labs (all labs ordered are listed, but only abnormal results are displayed) Labs Reviewed  COMPREHENSIVE METABOLIC PANEL - Abnormal; Notable for the following components:      Result Value   Sodium 134 (*)    Glucose, Bld 102 (*)    BUN 27 (*)     Calcium 8.6 (*)    All other components within normal limits  CBC WITH DIFFERENTIAL/PLATELET - Abnormal; Notable for the following components:   WBC 14.0 (*)    HCT 38.4 (*)    Neutro Abs 12.4 (*)    Abs Immature Granulocytes 0.10 (*)    All other components within normal limits  CULTURE, BLOOD (ROUTINE X 2)  CULTURE, BLOOD (ROUTINE X 2)    EKG None  Radiology No results found.  Procedures Procedures   Medications Ordered in ED Medications  sodium chloride 0.9 % bolus 1,000 mL (has no administration in time range)    ED Course  I have reviewed the triage vital signs and the nursing notes.  Pertinent labs & imaging results that were available during my care of  the patient were reviewed by me and considered in my medical decision making (see chart for details).    MDM Rules/Calculators/A&P                          Simpson Paulos is on chemotherapy for left testicular cancer.  He presented after a syncopal episode.  He does admit that he is having a lot of nausea and anorexia.  He was well-appearing, and he did have a slightly elevated white blood cell count.  He has recently been admitted for neutropenia, and he does receive Neupogen.  No signs or symptoms of an infection, but cultures are pending.  He was feeling much better after hydration and agrees to attempt to increase his oral intake on his own.  He will be seen by his oncologist early next week. Final Clinical Impression(s) / ED Diagnoses Final diagnoses:  Syncope, unspecified syncope type    Rx / DC Orders ED Discharge Orders    None       Arnaldo Natal, MD 11/14/20 [phone]

## 2020-11-19 LAB — CULTURE, BLOOD (ROUTINE X 2)
Culture: NO GROWTH
Culture: NO GROWTH
